# Patient Record
Sex: Male | Born: 1945 | Race: White | Hispanic: No | Marital: Single | Smoking: Former smoker
Health system: Southern US, Community
[De-identification: ages and names within clinical notes are randomized; demographics above are authoritative.]

## PROBLEM LIST (undated history)

## (undated) DIAGNOSIS — J189 Pneumonia, unspecified organism: Secondary | ICD-10-CM

## (undated) DIAGNOSIS — M1990 Unspecified osteoarthritis, unspecified site: Secondary | ICD-10-CM

## (undated) DIAGNOSIS — F329 Major depressive disorder, single episode, unspecified: Secondary | ICD-10-CM

## (undated) DIAGNOSIS — C801 Malignant (primary) neoplasm, unspecified: Secondary | ICD-10-CM

## (undated) DIAGNOSIS — E785 Hyperlipidemia, unspecified: Secondary | ICD-10-CM

## (undated) DIAGNOSIS — F32A Depression, unspecified: Secondary | ICD-10-CM

## (undated) DIAGNOSIS — I10 Essential (primary) hypertension: Secondary | ICD-10-CM

## (undated) HISTORY — PX: COLONOSCOPY: SHX174

---

## 1978-12-09 DIAGNOSIS — J189 Pneumonia, unspecified organism: Secondary | ICD-10-CM

## 1978-12-09 HISTORY — DX: Pneumonia, unspecified organism: J18.9

## 2004-08-15 ENCOUNTER — Ambulatory Visit (HOSPITAL_COMMUNITY): Admission: RE | Admit: 2004-08-15 | Discharge: 2004-08-15 | Payer: Self-pay | Admitting: Orthopedic Surgery

## 2004-12-09 DIAGNOSIS — C801 Malignant (primary) neoplasm, unspecified: Secondary | ICD-10-CM

## 2004-12-09 HISTORY — DX: Malignant (primary) neoplasm, unspecified: C80.1

## 2004-12-09 HISTORY — PX: RADIOACTIVE SEED IMPLANT: SHX5150

## 2005-08-30 ENCOUNTER — Ambulatory Visit: Admission: RE | Admit: 2005-08-30 | Discharge: 2005-09-05 | Payer: Self-pay | Admitting: Radiation Oncology

## 2005-09-18 ENCOUNTER — Encounter: Admission: RE | Admit: 2005-09-18 | Discharge: 2005-09-18 | Payer: Self-pay | Admitting: Urology

## 2005-09-18 ENCOUNTER — Ambulatory Visit: Admission: RE | Admit: 2005-09-18 | Discharge: 2005-12-17 | Payer: Self-pay | Admitting: Radiation Oncology

## 2005-10-17 ENCOUNTER — Ambulatory Visit (HOSPITAL_BASED_OUTPATIENT_CLINIC_OR_DEPARTMENT_OTHER): Admission: RE | Admit: 2005-10-17 | Discharge: 2005-10-17 | Payer: Self-pay | Admitting: Urology

## 2005-10-17 ENCOUNTER — Ambulatory Visit (HOSPITAL_COMMUNITY): Admission: RE | Admit: 2005-10-17 | Discharge: 2005-10-17 | Payer: Self-pay | Admitting: Urology

## 2006-08-25 ENCOUNTER — Ambulatory Visit: Payer: Self-pay | Admitting: Gastroenterology

## 2006-09-17 ENCOUNTER — Ambulatory Visit: Payer: Self-pay | Admitting: Gastroenterology

## 2006-10-01 ENCOUNTER — Ambulatory Visit: Payer: Self-pay | Admitting: Gastroenterology

## 2011-10-17 ENCOUNTER — Encounter: Payer: Self-pay | Admitting: Gastroenterology

## 2012-09-04 ENCOUNTER — Encounter: Payer: Self-pay | Admitting: Gastroenterology

## 2014-08-18 ENCOUNTER — Other Ambulatory Visit: Payer: Self-pay | Admitting: Orthopedic Surgery

## 2014-09-02 ENCOUNTER — Ambulatory Visit (HOSPITAL_COMMUNITY)
Admission: RE | Admit: 2014-09-02 | Discharge: 2014-09-02 | Disposition: A | Discharge status: Home / Self Care | Payer: Medicare Other | Source: Ambulatory Visit | Attending: Orthopedic Surgery | Admitting: Orthopedic Surgery

## 2014-09-02 ENCOUNTER — Encounter (HOSPITAL_COMMUNITY): Payer: Self-pay

## 2014-09-02 ENCOUNTER — Encounter (HOSPITAL_COMMUNITY)
Admission: RE | Admit: 2014-09-02 | Discharge: 2014-09-02 | Disposition: A | Discharge status: Home / Self Care | Payer: Medicare Other | Source: Ambulatory Visit | Attending: Orthopedic Surgery | Admitting: Orthopedic Surgery

## 2014-09-02 DIAGNOSIS — Z96659 Presence of unspecified artificial knee joint: Secondary | ICD-10-CM | POA: Diagnosis not present

## 2014-09-02 HISTORY — DX: Hyperlipidemia, unspecified: E78.5

## 2014-09-02 HISTORY — DX: Unspecified osteoarthritis, unspecified site: M19.90

## 2014-09-02 HISTORY — DX: Pneumonia, unspecified organism: J18.9

## 2014-09-02 HISTORY — DX: Essential (primary) hypertension: I10

## 2014-09-02 HISTORY — DX: Malignant (primary) neoplasm, unspecified: C80.1

## 2014-09-02 LAB — COMPREHENSIVE METABOLIC PANEL
ALT: 17 U/L (ref 0–53)
AST: 20 U/L (ref 0–37)
Albumin: 4.1 g/dL (ref 3.5–5.2)
Alkaline Phosphatase: 41 U/L (ref 39–117)
Anion gap: 13 (ref 5–15)
BILIRUBIN TOTAL: 0.7 mg/dL (ref 0.3–1.2)
BUN: 17 mg/dL (ref 6–23)
CHLORIDE: 97 meq/L (ref 96–112)
CO2: 27 meq/L (ref 19–32)
Calcium: 9.3 mg/dL (ref 8.4–10.5)
Creatinine, Ser: 1.09 mg/dL (ref 0.50–1.35)
GFR calc Af Amer: 79 mL/min — ABNORMAL LOW (ref >90)
GFR, EST NON AFRICAN AMERICAN: 68 mL/min — AB (ref >90)
Glucose, Bld: 93 mg/dL (ref 70–99)
Potassium: 4.2 mEq/L (ref 3.7–5.3)
Sodium: 137 mEq/L (ref 137–147)
Total Protein: 6.9 g/dL (ref 6.0–8.3)

## 2014-09-02 LAB — CBC WITH DIFFERENTIAL/PLATELET
BASOS ABS: 0 10*3/uL (ref 0.0–0.1)
Basophils Relative: 1 % (ref 0–1)
Eosinophils Absolute: 0.3 10*3/uL (ref 0.0–0.7)
Eosinophils Relative: 4 % (ref 0–5)
HEMATOCRIT: 41.3 % (ref 39.0–52.0)
Hemoglobin: 14.5 g/dL (ref 13.0–17.0)
Lymphocytes Relative: 23 % (ref 12–46)
Lymphs Abs: 1.6 10*3/uL (ref 0.7–4.0)
MCH: 28.7 pg (ref 26.0–34.0)
MCHC: 35.1 g/dL (ref 30.0–36.0)
MCV: 81.6 fL (ref 78.0–100.0)
MONOS PCT: 11 % (ref 3–12)
Monocytes Absolute: 0.8 10*3/uL (ref 0.1–1.0)
NEUTROS ABS: 4.3 10*3/uL (ref 1.7–7.7)
Neutrophils Relative %: 61 % (ref 43–77)
Platelets: 193 10*3/uL (ref 150–400)
RBC: 5.06 MIL/uL (ref 4.22–5.81)
RDW: 12.7 % (ref 11.5–15.5)
WBC: 7 10*3/uL (ref 4.0–10.5)

## 2014-09-02 LAB — URINALYSIS, ROUTINE W REFLEX MICROSCOPIC
Bilirubin Urine: NEGATIVE
GLUCOSE, UA: NEGATIVE mg/dL
Hgb urine dipstick: NEGATIVE
KETONES UR: NEGATIVE mg/dL
Leukocytes, UA: NEGATIVE
Nitrite: NEGATIVE
PROTEIN: NEGATIVE mg/dL
Specific Gravity, Urine: 1.011 (ref 1.005–1.030)
Urobilinogen, UA: 1 mg/dL (ref 0.0–1.0)
pH: 6 (ref 5.0–8.0)

## 2014-09-02 LAB — APTT: APTT: 31 s (ref 24–37)

## 2014-09-02 LAB — PROTIME-INR
INR: 0.99 (ref 0.00–1.49)
PROTHROMBIN TIME: 13.1 s (ref 11.6–15.2)

## 2014-09-02 LAB — SURGICAL PCR SCREEN
MRSA, PCR: NEGATIVE
STAPHYLOCOCCUS AUREUS: NEGATIVE

## 2014-09-02 NOTE — Progress Notes (Signed)
Primary - dr. Wenda Overland No cardiologist Stress test, ekg dr. Sabra Heck at cardiology consultant few years ago - will request.

## 2014-09-02 NOTE — Progress Notes (Signed)
09/02/14 1002  OBSTRUCTIVE SLEEP APNEA  Have you ever been diagnosed with sleep apnea through a sleep study? No  Do you snore loudly (loud enough to be heard through closed doors)?  0  Do you often feel tired, fatigued, or sleepy during the daytime? 1  Has anyone observed you stop breathing during your sleep? 0  Do you have, or are you being treated for high blood pressure? 1  BMI more than 35 kg/m2? 0  Age over 69 years old? 1  Neck circumference greater than 40 cm/16 inches? 1  Gender: 1  Obstructive Sleep Apnea Score 5  Score 4 or greater  Results sent to PCP

## 2014-09-02 NOTE — Pre-Procedure Instructions (Signed)
[facility]  09/02/2014   Your procedure is scheduled on:  Monday, October 5th  Report to [facility] at [phone] AM.  Call this number if you have problems the morning of surgery: [phone]   Remember:   Do not eat food or drink liquids after midnight.   Take these medicines the morning of surgery with A SIP OF WATER:    Do not wear jewelry, make-up or nail polish.  Do not wear lotions, powders, or perfumes. You may wear deodorant.  Do not shave 48 hours prior to surgery. Men may shave face and neck.  Do not bring valuables to the hospital.  [facility] is not responsible for any belongings or valuables.               Contacts, dentures or bridgework may not be worn into surgery.  Leave suitcase in the car. After surgery it may be brought to your room.  For patients admitted to the hospital, discharge time is determined by your treatment team.           Please read over the following fact sheets that you were given: Pain Booklet, Coughing and Deep Breathing, Blood Transfusion Information, MRSA Information and Surgical Site Infection Prevention [facility] - Preparing for Surgery  Before surgery, you can play an important role.  Because skin is not sterile, your skin needs to be as free of germs as possible.  You can reduce the number of germs on you skin by washing with CHG (chlorahexidine gluconate) soap before surgery.  CHG is an antiseptic cleaner which kills germs and bonds with the skin to continue killing germs even after washing.  Please DO NOT use if you have an allergy to CHG or antibacterial soaps.  If your skin becomes reddened/irritated stop using the CHG and inform your nurse when you arrive at Short Stay.  Do not shave (including legs and underarms) for at least 48 hours prior to the first CHG shower.  You may shave your face.  Please follow these instructions carefully:   1.  Shower with CHG Soap the night before surgery and the morning of  Surgery.  2.  If you choose to wash your hair, wash your hair first as usual with your normal shampoo.  3.  After you shampoo, rinse your hair and body thoroughly to remove the shampoo.  4.  Use CHG as you would any other liquid soap.  You can apply CHG directly to the skin and wash gently with scrungie or a clean washcloth.  5.  Apply the CHG Soap to your body ONLY FROM THE NECK DOWN.  Do not use on open wounds or open sores.  Avoid contact with your eyes, ears, mouth and genitals (private parts).  Wash genitals (private parts) with your normal soap.  6.  Wash thoroughly, paying special attention to the area where your surgery will be performed.  7.  Thoroughly rinse your body with warm water from the neck down.  8.  DO NOT shower/wash with your normal soap after using and rinsing off the CHG Soap.  9.  Pat yourself dry with a clean towel.            10.  Wear clean pajamas.            11.  Place clean sheets on your bed the night of your first shower and do not sleep with pets.  Day of Surgery  Do not apply any  lotions/deoderants the morning of surgery.  Please wear clean clothes to the hospital/surgery center.

## 2014-09-03 LAB — URINE CULTURE: Colony Count: 2000

## 2014-09-06 ENCOUNTER — Inpatient Hospital Stay (HOSPITAL_COMMUNITY): Admission: RE | Admit: 2014-09-06 | Payer: Self-pay | Source: Ambulatory Visit

## 2014-09-11 MED ORDER — TRANEXAMIC ACID 100 MG/ML IV SOLN
1000.0000 mg | INTRAVENOUS | Status: AC
  Administered 2014-09-12: 1000 mg via INTRAVENOUS
  Filled 2014-09-11: qty 10

## 2014-09-11 MED ORDER — CEFAZOLIN SODIUM-DEXTROSE 2-3 GM-% IV SOLR
2.0000 g | INTRAVENOUS | Status: AC
  Administered 2014-09-12: 2 g via INTRAVENOUS
  Filled 2014-09-11: qty 50

## 2014-09-11 MED ORDER — CHLORHEXIDINE GLUCONATE 4 % EX LIQD
60.0000 mL | Freq: Once | CUTANEOUS | Status: DC
  Filled 2014-09-11: qty 60

## 2014-09-11 MED ORDER — SODIUM CHLORIDE 0.9 % IV SOLN: INTRAVENOUS | Status: DC

## 2014-09-11 MED ORDER — BUPIVACAINE LIPOSOME 1.3 % IJ SUSP
20.0000 mL | Freq: Once | INTRAMUSCULAR | Status: DC
  Filled 2014-09-11: qty 20

## 2014-09-12 ENCOUNTER — Encounter (HOSPITAL_COMMUNITY): Payer: Self-pay | Admitting: Anesthesiology

## 2014-09-12 ENCOUNTER — Inpatient Hospital Stay (HOSPITAL_COMMUNITY): Payer: Medicare Other | Admitting: Certified Registered Nurse Anesthetist

## 2014-09-12 ENCOUNTER — Encounter (HOSPITAL_COMMUNITY)
Admission: RE | Disposition: A | Discharge status: Home Health | Payer: Self-pay | Source: Ambulatory Visit | Attending: Orthopedic Surgery

## 2014-09-12 ENCOUNTER — Encounter (HOSPITAL_COMMUNITY): Payer: Medicare Other | Admitting: Certified Registered Nurse Anesthetist

## 2014-09-12 ENCOUNTER — Inpatient Hospital Stay (HOSPITAL_COMMUNITY)
Admission: RE | Admit: 2014-09-12 | Discharge: 2014-09-14 | DRG: 470 | Disposition: A | Discharge status: Home Health | Payer: Medicare Other | Source: Ambulatory Visit | Attending: Orthopedic Surgery | Admitting: Orthopedic Surgery

## 2014-09-12 DIAGNOSIS — D62 Acute posthemorrhagic anemia: Secondary | ICD-10-CM | POA: Diagnosis not present

## 2014-09-12 DIAGNOSIS — I10 Essential (primary) hypertension: Secondary | ICD-10-CM | POA: Diagnosis present

## 2014-09-12 DIAGNOSIS — M179 Osteoarthritis of knee, unspecified: Secondary | ICD-10-CM | POA: Diagnosis present

## 2014-09-12 DIAGNOSIS — Z96659 Presence of unspecified artificial knee joint: Secondary | ICD-10-CM

## 2014-09-12 DIAGNOSIS — M25561 Pain in right knee: Secondary | ICD-10-CM | POA: Diagnosis present

## 2014-09-12 HISTORY — DX: Major depressive disorder, single episode, unspecified: F32.9

## 2014-09-12 HISTORY — PX: TOTAL KNEE ARTHROPLASTY: SHX125

## 2014-09-12 HISTORY — DX: Depression, unspecified: F32.A

## 2014-09-12 LAB — CBC
HEMATOCRIT: 39.6 % (ref 39.0–52.0)
Hemoglobin: 13.8 g/dL (ref 13.0–17.0)
MCH: 29.1 pg (ref 26.0–34.0)
MCHC: 34.8 g/dL (ref 30.0–36.0)
MCV: 83.5 fL (ref 78.0–100.0)
Platelets: 185 10*3/uL (ref 150–400)
RBC: 4.74 MIL/uL (ref 4.22–5.81)
RDW: 12.5 % (ref 11.5–15.5)
WBC: 5.9 10*3/uL (ref 4.0–10.5)

## 2014-09-12 LAB — CREATININE, SERUM
CREATININE: 1.07 mg/dL (ref 0.50–1.35)
GFR calc Af Amer: 80 mL/min — ABNORMAL LOW (ref >90)
GFR, EST NON AFRICAN AMERICAN: 69 mL/min — AB (ref >90)

## 2014-09-12 SURGERY — ARTHROPLASTY, KNEE, TOTAL
Anesthesia: Regional | Site: Knee | Laterality: Right

## 2014-09-12 MED ORDER — ACETAMINOPHEN 325 MG PO TABS
650.0000 mg | ORAL_TABLET | Freq: Four times a day (QID) | ORAL | Status: DC | PRN
Start: 2014-09-12 — End: 2014-09-14

## 2014-09-12 MED ORDER — MIDAZOLAM HCL 2 MG/2ML IJ SOLN
INTRAMUSCULAR | Status: AC
  Administered 2014-09-12: 2 mg via INTRAVENOUS
  Filled 2014-09-12: qty 2

## 2014-09-12 MED ORDER — OXYCODONE HCL ER 10 MG PO T12A
10.0000 mg | EXTENDED_RELEASE_TABLET | Freq: Two times a day (BID) | ORAL | Status: DC
  Administered 2014-09-12 – 2014-09-14 (×4): 10 mg via ORAL
  Filled 2014-09-12 (×4): qty 1

## 2014-09-12 MED ORDER — LACTATED RINGERS IV SOLN
INTRAVENOUS | Status: DC | PRN
  Administered 2014-09-12: 10:00:00 via INTRAVENOUS

## 2014-09-12 MED ORDER — CEFAZOLIN SODIUM-DEXTROSE 2-3 GM-% IV SOLR
2.0000 g | Freq: Four times a day (QID) | INTRAVENOUS | Status: AC
  Administered 2014-09-12: 2 g via INTRAVENOUS
  Filled 2014-09-12 (×2): qty 50

## 2014-09-12 MED ORDER — MENTHOL 3 MG MT LOZG: 1.0000 | LOZENGE | OROMUCOSAL | Status: DC | PRN

## 2014-09-12 MED ORDER — SODIUM CHLORIDE 0.9 % IV SOLN: INTRAVENOUS | Status: DC

## 2014-09-12 MED ORDER — ENOXAPARIN SODIUM 30 MG/0.3ML ~~LOC~~ SOLN
30.0000 mg | Freq: Two times a day (BID) | SUBCUTANEOUS | Status: DC
  Administered 2014-09-13 – 2014-09-14 (×3): 30 mg via SUBCUTANEOUS
  Filled 2014-09-12 (×6): qty 0.3

## 2014-09-12 MED ORDER — ONDANSETRON HCL 4 MG/2ML IJ SOLN
4.0000 mg | Freq: Four times a day (QID) | INTRAMUSCULAR | Status: DC | PRN
  Administered 2014-09-12 – 2014-09-13 (×2): 4 mg via INTRAVENOUS
  Filled 2014-09-12 (×2): qty 2

## 2014-09-12 MED ORDER — HYDROMORPHONE HCL 1 MG/ML IJ SOLN
INTRAMUSCULAR | Status: AC
  Filled 2014-09-12: qty 1

## 2014-09-12 MED ORDER — MIDAZOLAM HCL 2 MG/2ML IJ SOLN
2.0000 mg | Freq: Once | INTRAMUSCULAR | Status: AC
  Administered 2014-09-12: 2 mg via INTRAVENOUS

## 2014-09-12 MED ORDER — HYDROMORPHONE HCL 1 MG/ML IJ SOLN
0.2500 mg | INTRAMUSCULAR | Status: DC | PRN
  Administered 2014-09-12 (×2): 0.5 mg via INTRAVENOUS

## 2014-09-12 MED ORDER — LIDOCAINE HCL (CARDIAC) 20 MG/ML IV SOLN
INTRAVENOUS | Status: DC | PRN
  Administered 2014-09-12: 50 mg via INTRAVENOUS

## 2014-09-12 MED ORDER — PHENOL 1.4 % MT LIQD: 1.0000 | OROMUCOSAL | Status: DC | PRN

## 2014-09-12 MED ORDER — ALUM & MAG HYDROXIDE-SIMETH 200-200-20 MG/5ML PO SUSP: 30.0000 mL | ORAL | Status: DC | PRN

## 2014-09-12 MED ORDER — SODIUM CHLORIDE 0.9 % IR SOLN
Status: DC | PRN
  Administered 2014-09-12: 1000 mL

## 2014-09-12 MED ORDER — CHLORTHALIDONE 25 MG PO TABS
25.0000 mg | ORAL_TABLET | Freq: Every day | ORAL | Status: DC
  Administered 2014-09-12 – 2014-09-13 (×2): 25 mg via ORAL
  Filled 2014-09-12 (×3): qty 1

## 2014-09-12 MED ORDER — BUPIVACAINE LIPOSOME 1.3 % IJ SUSP
INTRAMUSCULAR | Status: DC | PRN
  Administered 2014-09-12: 20 mL

## 2014-09-12 MED ORDER — DOCUSATE SODIUM 100 MG PO CAPS
100.0000 mg | ORAL_CAPSULE | Freq: Two times a day (BID) | ORAL | Status: DC
  Administered 2014-09-12 – 2014-09-14 (×4): 100 mg via ORAL
  Filled 2014-09-12 (×6): qty 1

## 2014-09-12 MED ORDER — SENNOSIDES-DOCUSATE SODIUM 8.6-50 MG PO TABS: 1.0000 | ORAL_TABLET | Freq: Every evening | ORAL | Status: DC | PRN

## 2014-09-12 MED ORDER — MIDAZOLAM HCL 5 MG/5ML IJ SOLN
INTRAMUSCULAR | Status: DC | PRN
  Administered 2014-09-12: 2 mg via INTRAVENOUS

## 2014-09-12 MED ORDER — OXYCODONE HCL 5 MG PO TABS
ORAL_TABLET | ORAL | Status: AC
  Filled 2014-09-12: qty 1

## 2014-09-12 MED ORDER — FENTANYL CITRATE 0.05 MG/ML IJ SOLN
100.0000 ug | Freq: Once | INTRAMUSCULAR | Status: AC
  Administered 2014-09-12: 100 ug via INTRAVENOUS

## 2014-09-12 MED ORDER — EPHEDRINE SULFATE 50 MG/ML IJ SOLN
INTRAMUSCULAR | Status: DC | PRN
  Administered 2014-09-12: 10 mg via INTRAVENOUS

## 2014-09-12 MED ORDER — HYDROMORPHONE HCL 1 MG/ML IJ SOLN
1.0000 mg | INTRAMUSCULAR | Status: DC | PRN
Start: 2014-09-12 — End: 2014-09-14
  Administered 2014-09-13: 1 mg via INTRAVENOUS
  Filled 2014-09-12 (×3): qty 1

## 2014-09-12 MED ORDER — PROMETHAZINE HCL 25 MG/ML IJ SOLN: 6.2500 mg | INTRAMUSCULAR | Status: DC | PRN

## 2014-09-12 MED ORDER — CELECOXIB 200 MG PO CAPS
200.0000 mg | ORAL_CAPSULE | Freq: Two times a day (BID) | ORAL | Status: DC
  Administered 2014-09-12 – 2014-09-14 (×4): 200 mg via ORAL
  Filled 2014-09-12 (×5): qty 1

## 2014-09-12 MED ORDER — FENTANYL CITRATE 0.05 MG/ML IJ SOLN
INTRAMUSCULAR | Status: AC
  Filled 2014-09-12: qty 5

## 2014-09-12 MED ORDER — METHOCARBAMOL 1000 MG/10ML IJ SOLN
500.0000 mg | Freq: Four times a day (QID) | INTRAVENOUS | Status: DC | PRN
  Filled 2014-09-12: qty 5

## 2014-09-12 MED ORDER — OXYCODONE HCL 5 MG PO TABS
5.0000 mg | ORAL_TABLET | Freq: Once | ORAL | Status: AC | PRN
  Administered 2014-09-12: 5 mg via ORAL

## 2014-09-12 MED ORDER — OXYCODONE HCL 5 MG PO TABS
5.0000 mg | ORAL_TABLET | ORAL | Status: DC | PRN
  Administered 2014-09-12 – 2014-09-14 (×7): 10 mg via ORAL
  Filled 2014-09-12 (×4): qty 2
  Filled 2014-09-12: qty 1
  Filled 2014-09-12 (×2): qty 2
  Filled 2014-09-12: qty 1

## 2014-09-12 MED ORDER — BUPIVACAINE-EPINEPHRINE (PF) 0.5% -1:200000 IJ SOLN
INTRAMUSCULAR | Status: AC
  Filled 2014-09-12: qty 30

## 2014-09-12 MED ORDER — SIMVASTATIN 40 MG PO TABS
40.0000 mg | ORAL_TABLET | Freq: Every day | ORAL | Status: DC
  Administered 2014-09-12 – 2014-09-14 (×3): 40 mg via ORAL
  Filled 2014-09-12 (×3): qty 1

## 2014-09-12 MED ORDER — FENTANYL CITRATE 0.05 MG/ML IJ SOLN
INTRAMUSCULAR | Status: DC | PRN
  Administered 2014-09-12: 50 ug via INTRAVENOUS

## 2014-09-12 MED ORDER — BUPIVACAINE-EPINEPHRINE 0.5% -1:200000 IJ SOLN
INTRAMUSCULAR | Status: DC | PRN
  Administered 2014-09-12: 20 mL

## 2014-09-12 MED ORDER — MIDAZOLAM HCL 2 MG/2ML IJ SOLN
INTRAMUSCULAR | Status: AC
  Filled 2014-09-12: qty 2

## 2014-09-12 MED ORDER — METOCLOPRAMIDE HCL 5 MG PO TABS
5.0000 mg | ORAL_TABLET | Freq: Three times a day (TID) | ORAL | Status: DC | PRN
  Administered 2014-09-13: 10 mg via ORAL
  Filled 2014-09-12 (×2): qty 2

## 2014-09-12 MED ORDER — ATENOLOL 50 MG PO TABS
50.0000 mg | ORAL_TABLET | Freq: Every day | ORAL | Status: DC
  Administered 2014-09-12: 50 mg via ORAL
  Filled 2014-09-12 (×3): qty 1

## 2014-09-12 MED ORDER — OXYCODONE HCL 5 MG/5ML PO SOLN: 5.0000 mg | Freq: Once | ORAL | Status: AC | PRN

## 2014-09-12 MED ORDER — BUPIVACAINE HCL (PF) 0.75 % IJ SOLN
INTRAMUSCULAR | Status: DC | PRN
  Administered 2014-09-12: 12 mg via INTRATHECAL

## 2014-09-12 MED ORDER — ATENOLOL-CHLORTHALIDONE 50-25 MG PO TABS: 1.0000 | ORAL_TABLET | Freq: Every day | ORAL | Status: DC

## 2014-09-12 MED ORDER — PROPOFOL 10 MG/ML IV BOLUS
INTRAVENOUS | Status: AC
  Filled 2014-09-12: qty 20

## 2014-09-12 MED ORDER — BISACODYL 5 MG PO TBEC: 5.0000 mg | DELAYED_RELEASE_TABLET | Freq: Every day | ORAL | Status: DC | PRN

## 2014-09-12 MED ORDER — ZOLPIDEM TARTRATE 5 MG PO TABS
5.0000 mg | ORAL_TABLET | Freq: Every evening | ORAL | Status: DC | PRN
  Administered 2014-09-12 – 2014-09-13 (×2): 5 mg via ORAL
  Filled 2014-09-12 (×2): qty 1

## 2014-09-12 MED ORDER — PROPOFOL INFUSION 10 MG/ML OPTIME
INTRAVENOUS | Status: DC | PRN
  Administered 2014-09-12: 50 ug/kg/min via INTRAVENOUS

## 2014-09-12 MED ORDER — ROPIVACAINE HCL 5 MG/ML IJ SOLN
INTRAMUSCULAR | Status: DC | PRN
  Administered 2014-09-12: 30 mL via PERINEURAL

## 2014-09-12 MED ORDER — ONDANSETRON HCL 4 MG PO TABS
4.0000 mg | ORAL_TABLET | Freq: Four times a day (QID) | ORAL | Status: DC | PRN
  Administered 2014-09-13: 4 mg via ORAL
  Filled 2014-09-12: qty 1

## 2014-09-12 MED ORDER — FENTANYL CITRATE 0.05 MG/ML IJ SOLN
INTRAMUSCULAR | Status: AC
  Administered 2014-09-12: 100 ug via INTRAVENOUS
  Filled 2014-09-12: qty 2

## 2014-09-12 MED ORDER — METOCLOPRAMIDE HCL 5 MG/ML IJ SOLN: 5.0000 mg | Freq: Three times a day (TID) | INTRAMUSCULAR | Status: DC | PRN

## 2014-09-12 MED ORDER — FLEET ENEMA 7-19 GM/118ML RE ENEM: 1.0000 | ENEMA | Freq: Once | RECTAL | Status: AC | PRN

## 2014-09-12 MED ORDER — ACETAMINOPHEN 650 MG RE SUPP: 650.0000 mg | Freq: Four times a day (QID) | RECTAL | Status: DC | PRN

## 2014-09-12 MED ORDER — DIPHENHYDRAMINE HCL 12.5 MG/5ML PO ELIX: 12.5000 mg | ORAL_SOLUTION | ORAL | Status: DC | PRN

## 2014-09-12 MED ORDER — LACTATED RINGERS IV SOLN
INTRAVENOUS | Status: DC
  Administered 2014-09-12: 50 mL/h via INTRAVENOUS

## 2014-09-12 MED ORDER — METHOCARBAMOL 500 MG PO TABS
500.0000 mg | ORAL_TABLET | Freq: Four times a day (QID) | ORAL | Status: DC | PRN
  Administered 2014-09-12 – 2014-09-14 (×4): 500 mg via ORAL
  Filled 2014-09-12 (×4): qty 1

## 2014-09-12 SURGICAL SUPPLY — 60 items
BANDAGE ESMARK 6X9 LF (GAUZE/BANDAGES/DRESSINGS) ×1 IMPLANT
BLADE SAGITTAL 13X1.27X60 (BLADE) ×2 IMPLANT
BLADE SAGITTAL 13X1.27X60MM (BLADE) ×1
BLADE SAW SGTL 83.5X18.5 (BLADE) ×3 IMPLANT
BLADE SURG 10 STRL SS (BLADE) ×4 IMPLANT
BNDG CMPR 9X6 STRL LF SNTH (GAUZE/BANDAGES/DRESSINGS) ×1
BNDG ESMARK 6X9 LF (GAUZE/BANDAGES/DRESSINGS) ×3
BOWL SMART MIX CTS (DISPOSABLE) ×3 IMPLANT
CAP POR TM CP VIT E LN CER HD ×2 IMPLANT
CEMENT BONE SIMPLEX SPEEDSET (Cement) ×6 IMPLANT
COVER SURGICAL LIGHT HANDLE (MISCELLANEOUS) ×3 IMPLANT
CUFF TOURNIQUET SINGLE 34IN LL (TOURNIQUET CUFF) ×3 IMPLANT
DRAPE EXTREMITY T 121X128X90 (DRAPE) ×3 IMPLANT
DRAPE INCISE IOBAN 66X45 STRL (DRAPES) ×6 IMPLANT
DRAPE PROXIMA HALF (DRAPES) ×3 IMPLANT
DRAPE U-SHAPE 47X51 STRL (DRAPES) ×3 IMPLANT
DRSG ADAPTIC 3X8 NADH LF (GAUZE/BANDAGES/DRESSINGS) ×3 IMPLANT
DRSG PAD ABDOMINAL 8X10 ST (GAUZE/BANDAGES/DRESSINGS) ×3 IMPLANT
DURAPREP 26ML APPLICATOR (WOUND CARE) ×6 IMPLANT
ELECT REM PT RETURN 9FT ADLT (ELECTROSURGICAL) ×3
ELECTRODE REM PT RTRN 9FT ADLT (ELECTROSURGICAL) ×1 IMPLANT
EVACUATOR 1/8 PVC DRAIN (DRAIN) ×3 IMPLANT
GAUZE SPONGE 4X4 12PLY STRL (GAUZE/BANDAGES/DRESSINGS) ×3 IMPLANT
GLOVE BIOGEL M 7.0 STRL (GLOVE) IMPLANT
GLOVE BIOGEL PI IND STRL 7.5 (GLOVE) IMPLANT
GLOVE BIOGEL PI IND STRL 8.5 (GLOVE) ×2 IMPLANT
GLOVE BIOGEL PI INDICATOR 7.5 (GLOVE)
GLOVE BIOGEL PI INDICATOR 8.5 (GLOVE) ×4
GLOVE SURG ORTHO 8.0 STRL STRW (GLOVE) ×6 IMPLANT
GOWN STRL REUS W/ TWL LRG LVL3 (GOWN DISPOSABLE) ×2 IMPLANT
GOWN STRL REUS W/ TWL XL LVL3 (GOWN DISPOSABLE) ×2 IMPLANT
GOWN STRL REUS W/TWL LRG LVL3 (GOWN DISPOSABLE) ×6
GOWN STRL REUS W/TWL XL LVL3 (GOWN DISPOSABLE) ×6
HANDPIECE INTERPULSE COAX TIP (DISPOSABLE) ×3
HOOD PEEL AWAY FACE SHEILD DIS (HOOD) ×12 IMPLANT
KIT BASIN OR (CUSTOM PROCEDURE TRAY) ×3 IMPLANT
KIT ROOM TURNOVER OR (KITS) ×3 IMPLANT
MANIFOLD NEPTUNE II (INSTRUMENTS) ×3 IMPLANT
NDL SPNL 18GX3.5 QUINCKE PK (NEEDLE) IMPLANT
NEEDLE 22X1 1/2 (OR ONLY) (NEEDLE) ×6 IMPLANT
NEEDLE SPNL 18GX3.5 QUINCKE PK (NEEDLE) ×3 IMPLANT
NS IRRIG 1000ML POUR BTL (IV SOLUTION) ×3 IMPLANT
PACK TOTAL JOINT (CUSTOM PROCEDURE TRAY) ×3 IMPLANT
PAD ARMBOARD 7.5X6 YLW CONV (MISCELLANEOUS) ×6 IMPLANT
PADDING CAST COTTON 6X4 STRL (CAST SUPPLIES) ×3 IMPLANT
SET HNDPC FAN SPRY TIP SCT (DISPOSABLE) ×1 IMPLANT
SPONGE GAUZE 4X4 12PLY STER LF (GAUZE/BANDAGES/DRESSINGS) ×2 IMPLANT
STAPLER VISISTAT 35W (STAPLE) ×3 IMPLANT
SUCTION FRAZIER TIP 10 FR DISP (SUCTIONS) ×3 IMPLANT
SUT BONE WAX W31G (SUTURE) ×3 IMPLANT
SUT VIC AB 0 CTB1 27 (SUTURE) ×6 IMPLANT
SUT VIC AB 1 CT1 27 (SUTURE) ×6
SUT VIC AB 1 CT1 27XBRD ANBCTR (SUTURE) ×2 IMPLANT
SUT VIC AB 2-0 CT1 27 (SUTURE) ×6
SUT VIC AB 2-0 CT1 TAPERPNT 27 (SUTURE) ×2 IMPLANT
SYR 20CC LL (SYRINGE) ×6 IMPLANT
TOWEL OR 17X24 6PK STRL BLUE (TOWEL DISPOSABLE) ×3 IMPLANT
TOWEL OR 17X26 10 PK STRL BLUE (TOWEL DISPOSABLE) ×3 IMPLANT
TRAY FOLEY CATH 14FR (SET/KITS/TRAYS/PACK) ×3 IMPLANT
WATER STERILE IRR 1000ML POUR (IV SOLUTION) ×6 IMPLANT

## 2014-09-12 NOTE — Progress Notes (Signed)
Orthopedic Tech Progress Note Patient Details:  Timothy Walton 10-07-1946 [id number] CPM applied to RLE with appropriate settings. OHF applied to bed. Footsie roll provided. CPM Right Knee CPM Right Knee: On Right Knee Flexion (Degrees): 90 Right Knee Extension (Degrees): 0   [name] 09/12/2014, 1:53 PM

## 2014-09-12 NOTE — Anesthesia Procedure Notes (Addendum)
Anesthesia Regional Block:  Adductor canal block  Pre-Anesthetic Checklist: ,, timeout performed, Correct Patient, Correct Site, Correct Laterality, Correct Procedure, Correct Position, site marked, Risks and benefits discussed,  Surgical consent,  Pre-op evaluation,  At surgeon's request and post-op pain management  Laterality: Right and Upper  Prep: chloraprep       Needles:   Needle Type: Echogenic Needle     Needle Length: 9cm 9 cm Needle Gauge: 21 and 21 G  Needle insertion depth: 5 cm   Additional Needles:  Procedures: ultrasound guided (picture in chart) Adductor canal block Narrative:  Start time: 09/12/2014 10:30 AM End time: 09/12/2014 10:45 AM Injection made incrementally with aspirations every 5 mL.  Performed by: Personally  Anesthesiologist: T Zubair Lofton  Additional Notes: Tolerated well   Spinal  Patient location during procedure: OR Start time: 09/12/2014 11:20 AM End time: 09/12/2014 11:30 AM Staffing Anesthesiologist: Sokha Craker Performed by: anesthesiologist  Preanesthetic Checklist Completed: patient identified, site marked, surgical consent, pre-op evaluation, timeout performed, IV checked, risks and benefits discussed and monitors and equipment checked Spinal Block Patient position: sitting Prep: Betadine Location: L3-4 Injection technique: single-shot Needle Needle type: Tuohy  Needle gauge: 25 G Needle length: 9 cm Needle insertion depth: 5 cm Assessment Sensory level: T6 Additional Notes Tolerated well

## 2014-09-12 NOTE — Progress Notes (Signed)
Notified OR nurse in room with Dr. Ronnie Derby of patient taking Goody PM last 2 nights, she stated she would notifiy DR.Lucey.

## 2014-09-12 NOTE — H&P (Signed)
  Timothy Walton MRN:  [id number] DOB/SEX:  05-21-1946/male  CHIEF COMPLAINT:  Painful right Knee  HISTORY: Patient is a 68 y.o. male presented with a history of pain in the right knee. Onset of symptoms was gradual starting several years ago with gradually worsening course since that time. Prior procedures on the knee include arthroscopy. Patient has been treated conservatively with over-the-counter NSAIDs and activity modification. Patient currently rates pain in the knee at 10 out of 10 with activity. There is pain at night.  PAST MEDICAL HISTORY: There are no active problems to display for this patient.  Past Medical History  Diagnosis Date  . Cancer 2006    prostate cancer  . Hypertension   . Pneumonia 79    hx of  . Arthritis   . Hyperlipemia    Past Surgical History  Procedure Laterality Date  . Colonoscopy    . Radioactive seed implant  2006    prostate     MEDICATIONS:   No prescriptions prior to admission    ALLERGIES:  No Known Allergies  REVIEW OF SYSTEMS:  A comprehensive review of systems was negative.   FAMILY HISTORY:  No family history on file.  SOCIAL HISTORY:   History  Substance Use Topics  . Smoking status: Former Research scientist (life sciences)  . Smokeless tobacco: Not on file  . Alcohol Use: 15.0 oz/week    25 Cans of beer per week     Comment: 4-6 beer /day     EXAMINATION:  Vital signs in last 24 hours:    General appearance: alert, cooperative and no distress Lungs: clear to auscultation bilaterally Heart: regular rate and rhythm, S1, S2 normal, no murmur, click, rub or gallop Abdomen: soft, non-tender; bowel sounds normal; no masses,  no organomegaly Extremities: extremities normal, atraumatic, no cyanosis or edema and Homans sign is negative, no sign of DVT Pulses: 2+ and symmetric Skin: Skin color, texture, turgor normal. No rashes or lesions Neurologic: Alert and oriented X 3, normal strength and tone. Normal symmetric reflexes. Normal coordination  and gait  Musculoskeletal:  ROM 0-115, Ligaments intact,  Imaging Review Plain radiographs demonstrate severe degenerative joint disease of the right knee. The overall alignment is significant varus. The bone quality appears to be good for age and reported activity level.  Assessment/Plan: End stage arthritis, right knee   The patient history, physical examination and imaging studies are consistent with advanced degenerative joint disease of the right knee. The patient has failed conservative treatment.  The clearance notes were reviewed.  After discussion with the patient it was felt that Total Knee Replacement was indicated. The procedure,  risks, and benefits of total knee arthroplasty were presented and reviewed. The risks including but not limited to aseptic loosening, infection, blood clots, vascular injury, stiffness, patella tracking problems complications among others were discussed. The patient acknowledged the explanation, agreed to proceed with the plan.  Timothy Walton 09/12/2014, 6:46 AM

## 2014-09-12 NOTE — Anesthesia Preprocedure Evaluation (Signed)
Anesthesia Evaluation  Patient identified by MRN, date of birth, ID band Patient awake    Reviewed: Allergy & Precautions, H&P , NPO status , Patient's Chart, lab work & pertinent test results  History of Anesthesia Complications Negative for: history of anesthetic complications  Airway Mallampati: II  Neck ROM: Full    Dental   Pulmonary former smoker,  breath sounds clear to auscultation        Cardiovascular hypertension, Rhythm:Regular Rate:Normal     Neuro/Psych    GI/Hepatic negative GI ROS, Neg liver ROS,   Endo/Other  negative endocrine ROS  Renal/GU negative Renal ROS     Musculoskeletal  (+) Arthritis -,   Abdominal (+) + obese,   Peds  Hematology   Anesthesia Other Findings   Reproductive/Obstetrics                           Anesthesia Physical Anesthesia Plan  ASA: II  Anesthesia Plan:    Post-op Pain Management:    Induction: Intravenous  Airway Management Planned:   Additional Equipment:   Intra-op Plan:   Post-operative Plan:   Informed Consent:   Plan Discussed with:   Anesthesia Plan Comments:         Anesthesia Quick Evaluation

## 2014-09-12 NOTE — Transfer of Care (Signed)
Immediate Anesthesia Transfer of Care Note  Patient: Timothy Walton  Procedure(s) Performed: Procedure(s): RIGHT TOTAL KNEE ARTHROPLASTY (Right)  Patient Location: PACU  Anesthesia Type:MAC  Level of Consciousness: awake, alert  and oriented  Airway & Oxygen Therapy: Patient Spontanous Breathing and Patient connected to nasal cannula oxygen  Post-op Assessment: Report given to PACU RN, Post -op Vital signs reviewed and stable and Patient moving all extremities X 4  Post vital signs: Reviewed and stable  Complications: No apparent anesthesia complications

## 2014-09-13 ENCOUNTER — Encounter (HOSPITAL_COMMUNITY): Payer: Self-pay | Admitting: General Practice

## 2014-09-13 LAB — BASIC METABOLIC PANEL
Anion gap: 14 (ref 5–15)
BUN: 17 mg/dL (ref 6–23)
CO2: 27 mEq/L (ref 19–32)
CREATININE: 1 mg/dL (ref 0.50–1.35)
Calcium: 8.5 mg/dL (ref 8.4–10.5)
Chloride: 94 mEq/L — ABNORMAL LOW (ref 96–112)
GFR calc Af Amer: 87 mL/min — ABNORMAL LOW (ref >90)
GFR, EST NON AFRICAN AMERICAN: 75 mL/min — AB (ref >90)
GLUCOSE: 119 mg/dL — AB (ref 70–99)
Potassium: 4.2 mEq/L (ref 3.7–5.3)
SODIUM: 135 meq/L — AB (ref 137–147)

## 2014-09-13 LAB — CBC
HCT: 35.9 % — ABNORMAL LOW (ref 39.0–52.0)
Hemoglobin: 12.4 g/dL — ABNORMAL LOW (ref 13.0–17.0)
MCH: 28.4 pg (ref 26.0–34.0)
MCHC: 34.5 g/dL (ref 30.0–36.0)
MCV: 82.2 fL (ref 78.0–100.0)
Platelets: 206 10*3/uL (ref 150–400)
RBC: 4.37 MIL/uL (ref 4.22–5.81)
RDW: 12.5 % (ref 11.5–15.5)
WBC: 10.3 10*3/uL (ref 4.0–10.5)

## 2014-09-13 NOTE — Op Note (Signed)
TOTAL KNEE REPLACEMENT OPERATIVE NOTE:  09/12/2014  4:34 PM  PATIENT:  Timothy Walton  68 y.o. male  PRE-OPERATIVE DIAGNOSIS:  osteoarthritis right knee  POST-OPERATIVE DIAGNOSIS:  OA right knee  PROCEDURE:  Procedure(s): RIGHT TOTAL KNEE ARTHROPLASTY  SURGEON:  Surgeon(s): Vickey Huger, MD  PHYSICIAN ASSISTANT: Carlynn Spry, [facility]  ANESTHESIA:   general  DRAINS: Hemovac  SPECIMEN: None  COUNTS:  Correct  TOURNIQUET:   Total Tourniquet Time Documented: Thigh (Right) - 50 minutes Total: Thigh (Right) - 50 minutes   DICTATION:  Indication for procedure:    The patient is a 68 y.o. male who has failed conservative treatment for osteoarthritis right knee.  Informed consent was obtained prior to anesthesia. The risks versus benefits of the operation were explain and in a way the patient can, and did, understand.   On the implant demand matching protocol, this patient scored 8.  Therefore, this patient was not receive a polyethylene insert with vitamin E which is a high demand implant.  Description of procedure:     The patient was taken to the operating room and placed under anesthesia.  The patient was positioned in the usual fashion taking care that all body parts were adequately padded and/or protected.  I foley catheter was not placed.  A tourniquet was applied and the leg prepped and draped in the usual sterile fashion.  The extremity was exsanguinated with the esmarch and tourniquet inflated to 350 mmHg.  Pre-operative range of motion was normal.  The knee was in 10 degree of mild varus.  A midline incision approximately 6-7 inches long was made with a #10 blade.  A new blade was used to make a parapatellar arthrotomy going 2-3 cm into the quadriceps tendon, over the patella, and alongside the medial aspect of the patellar tendon.  A synovectomy was then performed with the #10 blade and forceps. I then elevated the deep MCL off the medial tibial metaphysis subperiosteally  around to the semimembranosus attachment.    I everted the patella and used calipers to measure patellar thickness.  I used the reamer to ream down to appropriate thickness to recreate the native thickness.  I then removed excess bone with the rongeur and sagittal saw.  I used the appropriately sized template and drilled the three lug holes.  I then put the trial in place and measured the thickness with the calipers to ensure recreation of the native thickness.  The trial was then removed and the patella subluxed and the knee brought into flexion.  A homan retractor was place to retract and protect the patella and lateral structures.  A Z-retractor was place medially to protect the medial structures.  The extra-medullary alignment system was used to make cut the tibial articular surface perpendicular to the anamotic axis of the tibia and in 3 degrees of posterior slope.  The cut surface and alignment jig was removed.  I then used the intramedullary alignment guide to make a 3 valgus cut on the distal femur.  I then marked out the epicondylar axis on the distal femur.  The posterior condylar axis measured 3 degrees.  I then used the anterior referencing sizer and measured the femur to be a size 8.  The 4-In-1 cutting block was screwed into place in external rotation matching the posterior condylar angle, making our cuts perpendicular to the epicondylar axis.  Anterior, posterior and chamfer cuts were made with the sagittal saw.  The cutting block and cut pieces were removed.  A lamina spreader was placed in 90 degrees of flexion.  The ACL, PCL, menisci, and posterior condylar osteophytes were removed.  A 11 mm spacer blocked was found to offer good flexion and extension gap balance after minimal in degree releasing.   The scoop retractor was then placed and the femoral finishing block was pinned in place.  The small sagittal saw was used as well as the lug drill to finish the femur.  The block and cut  surfaces were removed and the medullary canal hole filled with autograft bone from the cut pieces.  The tibia was delivered forward in deep flexion and external rotation.  A size F tray was selected and pinned into place centered on the medial 1/3 of the tibial tubercle.  The reamer and keel was used to prepare the tibia through the tray.    I then trialed with the size 8 femur, size F tibia, a 11 mm insert and the 35 patella.  I had excellent flexion/extension gap balance, excellent patella tracking.  Flexion was full and beyond 120 degrees; extension was zero.  These components were chosen and the staff opened them to me on the back table while the knee was lavaged copiously and the cement mixed.  The soft tissue was infiltrated with 60cc of exparel 1.3% through a 21 gauge needle.  I cemented in the components and removed all excess cement.  The polyethylene tibial component was snapped into place and the knee placed in extension while cement was hardening.  The capsule was infilltrated with 30cc of .25% Marcaine with epinephrine.  A hemovac was place in the joint exiting superolaterally.  A pain pump was place superomedially superficial to the arthrotomy.  Once the cement was hard, the tourniquet was let down.  Hemostasis was obtained.  The arthrotomy was closed with figure-8 #1 vicryl sutures.  The deep soft tissues were closed with #0 vicryls and the subcuticular layer closed with a running #2-0 vicryl.  The skin was reapproximated and closed with skin staples.  The wound was dressed with xeroform, 4 x4's, 2 ABD sponges, a single layer of webril and a TED stocking.   The patient was then awakened, extubated, and taken to the recovery room in stable condition.  BLOOD LOSS:  300cc DRAINS: 1 hemovac, 1 pain catheter COMPLICATIONS:  None.  PLAN OF CARE: Admit to inpatient   PATIENT DISPOSITION:  PACU - hemodynamically stable.   Delay start of Pharmacological VTE agent (>24hrs) due to surgical  blood loss or risk of bleeding:  not applicable  Please fax a copy of this op note to my office at [phone] (please only include page 1 and 2 of the Case Information op note)

## 2014-09-13 NOTE — Progress Notes (Signed)
SPORTS MEDICINE AND JOINT REPLACEMENT  Lara Mulch, MD   Carlynn Spry, PA-C [place], [place], [place]  [id number]                             [phone]   PROGRESS NOTE  Subjective:  negative for Chest Pain  negative for Shortness of Breath  negative for Nausea/Vomiting   negative for Calf Pain  negative for Bowel Movement   Tolerating Diet: yes         Patient reports pain as 6 on 0-10 scale.    Objective: Vital signs in last 24 hours:   Patient Vitals for the past 24 hrs:  BP Temp Temp src Pulse Resp SpO2  09/13/14 0602 111/68 mmHg 98.1 F (36.7 C) - 64 18 96 %  09/13/14 0400 - - - - 18 96 %  09/13/14 0045 125/68 mmHg 98.6 F (37 C) - 65 18 96 %  09/12/14 2357 - - - - 18 96 %  09/12/14 2033 116/68 mmHg 98 F (36.7 C) - 60 18 96 %  09/12/14 2000 - - - - 18 96 %  09/12/14 1730 136/81 mmHg 98.2 F (36.8 C) Oral 65 18 100 %  09/12/14 1700 130/73 mmHg 97.9 F (36.6 C) - 63 17 100 %  09/12/14 1645 - - - 58 17 100 %  09/12/14 1630 111/68 mmHg - - 49 15 100 %  09/12/14 1615 - - - 54 15 100 %  09/12/14 1600 - - - 46 15 100 %  09/12/14 1558 116/66 mmHg - - 44 15 100 %  09/12/14 1545 - - - 55 14 100 %  09/12/14 1530 - - - 50 14 100 %  09/12/14 1527 116/76 mmHg - - 53 18 100 %  09/12/14 1515 - - - 54 14 100 %  09/12/14 1500 - 97.6 F (36.4 C) - 58 15 100 %  09/12/14 1457 117/60 mmHg - - 54 18 100 %  09/12/14 1445 - - - 54 15 100 %  09/12/14 1442 114/65 mmHg - - 51 16 100 %  09/12/14 1430 - - - 51 17 100 %  09/12/14 1427 114/65 mmHg - - 53 12 100 %  09/12/14 1415 - - - 58 13 100 %  09/12/14 1412 117/67 mmHg - - 51 16 100 %  09/12/14 1400 - - - 56 13 100 %  09/12/14 1357 107/71 mmHg - - 55 16 100 %  09/12/14 1345 - - - 57 15 99 %  09/12/14 1342 107/69 mmHg - - 54 15 100 %  09/12/14 1330 - - - 60 17 99 %  09/12/14 1327 116/62 mmHg - - 61 18 96 %  09/12/14 1325 - 97.4 F (36.3 C) - - - -  09/12/14 1050 136/74 mmHg - - 61 16 97 %  09/12/14 1045 - - -  62 11 95 %  09/12/14 1043 - - - 66 16 98 %  09/12/14 1042 139/72 mmHg - - 66 13 95 %  09/12/14 1041 - - - 67 12 95 %  09/12/14 1040 - - - 65 14 99 %  09/12/14 1039 - - - 65 18 99 %  09/12/14 1038 - - - 66 16 100 %  09/12/14 1037 150/71 mmHg - - 68 16 99 %  09/12/14 1036 - - - 66 19 99 %  09/12/14 1035 - - - 68 18 100 %  09/12/14 1034 - - - 62 11 100 %  09/12/14 1033 - - - 71 17 100 %  09/12/14 1032 150/86 mmHg - - 65 17 99 %  09/12/14 1031 - - - 64 15 98 %  09/12/14 1024 - - - 65 12 98 %  09/12/14 0924 141/85 mmHg 97.7 F (36.5 C) Oral 69 18 97 %    @flow {1959:LAST@   Intake/Output from previous day:   10/05 0701 - 10/06 0700 In: 1430 [P.O.:240; I.V.:950] Out: 1400 [Urine:950; Drains:350]   Intake/Output this shift:       Intake/Output     10/05 0701 - 10/06 0700 10/06 0701 - 10/07 0700   P.O. 240    I.V. 950    Other 130    IV Piggyback 110    Total Intake 1430     Urine 950    Drains 350    Blood 100    Total Output 1400     Net +30          Urine Occurrence 1 x       LABORATORY DATA:  Recent Labs  09/12/14 1440 09/13/14 0512  WBC 5.9 10.3  HGB 13.8 12.4*  HCT 39.6 35.9*  PLT 185 206    Recent Labs  09/12/14 1440 09/13/14 0512  NA  --  135*  K  --  4.2  CL  --  94*  CO2  --  27  BUN  --  17  CREATININE 1.07 1.00  GLUCOSE  --  119*  CALCIUM  --  8.5   Lab Results  Component Value Date   INR 0.99 09/02/2014    Examination:  General appearance: alert, cooperative and no distress Extremities: Homans sign is negative, no sign of DVT  Wound Exam: clean, dry, intact   Drainage:  Scant/small amount Serosanguinous exudate  Motor Exam: EHL and FHL Intact  Sensory Exam: Deep Peroneal normal   Assessment:    1 Day Post-Op  Procedure(s) (LRB): RIGHT TOTAL KNEE ARTHROPLASTY (Right)  ADDITIONAL DIAGNOSIS:  Active Problems:   S/P total knee arthroplasty  Acute Blood Loss Anemia   Plan: Physical Therapy as ordered Weight Bearing as  Tolerated (WBAT)  DVT Prophylaxis:  Lovenox  DISCHARGE PLAN: Home  DISCHARGE NEEDS: HHPT, CPM, Walker and 3-in-1 comode seat         Timothy Walton 09/13/2014, 7:20 AM

## 2014-09-13 NOTE — Evaluation (Signed)
Physical Therapy Evaluation Patient Details Name: Timothy Walton MRN: [id number] DOB: August 08, 1946 Today's Date: 09/13/2014   History of Present Illness  R TKA  Clinical Impression  *Pt is s/p TKA resulting in the deficits listed below (see PT Problem List). * Pt will benefit from skilled PT to increase their independence and safety with mobility to allow discharge to the venue listed below.   Assisted pt to edge of bed. In sitting he became dizzy, nauseous, and had vomiting. Sitting BP 95/59, SaO2 100% on RA, HR 79. Pt not able to tolerate transfers or gait, will attempt again later today. Good progress expected once nausea resolves.  **    Follow Up Recommendations Home health PT    Equipment Recommendations  None recommended by PT    Recommendations for Other Services OT consult     Precautions / Restrictions Precautions Precautions: Fall Restrictions RLE Weight Bearing: Weight bearing as tolerated      Mobility  Bed Mobility Overal bed mobility: Needs Assistance Bed Mobility: Supine to Sit;Sit to Supine     Supine to sit: Min assist Sit to supine: Min assist   General bed mobility comments: instructed pt to self assist RLE with LLE, min A to raise trunk, cues for technique  Transfers                 General transfer comment: NT-pt had dizziness, nausea and vomiting in sitting, BP sitting 95/59, HR 79, SaO2 100% RA  Ambulation/Gait                Stairs            Wheelchair Mobility    Modified Rankin (Stroke Patients Only)       Balance                                             Pertinent Vitals/Pain Pain Assessment: 0-10 Pain Score: 6  Pain Descriptors / Indicators: Sore Pain Intervention(s): Limited activity within patient's tolerance;Monitored during session;Premedicated before session;Repositioned;Relaxation;Ice applied    Home Living Family/patient expects to be discharged to:: Private residence Living  Arrangements: Children Available Help at Discharge: Available PRN/intermittently Type of Home: House Home Access: Stairs to enter Entrance Stairs-Rails: None Entrance Stairs-Number of Steps: 3 Home Layout: One level Home Equipment: Cane - single point;Walker - 2 wheels;Bedside commode Additional Comments: tub shower, handicapped height commode    Prior Function Level of Independence: Independent with assistive device(s)         Comments: used cane PTA, no falls     Hand Dominance        Extremity/Trunk Assessment   Upper Extremity Assessment: Overall WFL for tasks assessed           Lower Extremity Assessment: RLE deficits/detail RLE Deficits / Details: ankle WNL, quad set intact but weak, knee flexion AAROM 30* limited by pain, SLR -2/5; sensation intact to light touch    Cervical / Trunk Assessment: Normal  Communication   Communication: No difficulties  Cognition Arousal/Alertness: Awake/alert Behavior During Therapy: WFL for tasks assessed/performed Overall Cognitive Status: Within Functional Limits for tasks assessed                      General Comments      Exercises Total Joint Exercises Ankle Circles/Pumps: AROM;Both;10 reps Quad Sets: AROM;Left;5 reps Heel Slides: AAROM;Left;15 reps;Supine  Hip ABduction/ADduction: AAROM;Left;10 reps;Supine Goniometric ROM: L knee flexion AAROM 30* limited by pain      Assessment/Plan    PT Assessment Patient needs continued PT services  PT Diagnosis Difficulty walking;Acute pain   PT Problem List Decreased strength;Decreased range of motion;Decreased activity tolerance;Pain;Decreased knowledge of use of DME;Decreased mobility  PT Treatment Interventions DME instruction;Gait training;Stair training;Functional mobility training;Therapeutic activities;Patient/family education;Therapeutic exercise   PT Goals (Current goals can be found in the Care Plan section) Acute Rehab PT Goals Patient Stated Goal: to  be able to do yardwork PT Goal Formulation: With patient Time For Goal Achievement: 09/27/14 Potential to Achieve Goals: Good    Frequency 7X/week   Barriers to discharge        Co-evaluation               End of Session   Activity Tolerance: Treatment limited secondary to medical complications (Comment) (nausea, vomiting, dizziness) Patient left: in bed;with call bell/phone within reach;with nursing/sitter in room;with family/visitor present Nurse Communication: Mobility status         Time: 0912-1004 PT Time Calculation (min): 52 min   Charges:   PT Evaluation $Initial PT Evaluation Tier I: 1 Procedure PT Treatments $Therapeutic Exercise: 8-22 mins $Therapeutic Activity: 8-22 mins   PT G Codes:          Timothy Walton 09/13/2014, 10:14 AM  [phone]

## 2014-09-13 NOTE — Progress Notes (Signed)
Occupational Therapy Evaluation Patient Details Name: Timothy Walton MRN: [id number] DOB: 08/08/1946 Today's Date: 09/13/2014    History of Present Illness R TKA   Clinical Impression   PTA, pt lived alone and was independent with mod I with mobility @ cane level. Eval limited this due to becoming nauseated with movement. Educated pt/daughter on tub transfer techniques using 3 in 1. Will plan to see in am to complete education. Pt will not need Ot follow up and will have adequate supervision for D/C home.     Follow Up Recommendations  No OT follow up;Supervision - Intermittent    Equipment Recommendations  None recommended by OT    Recommendations for Other Services       Precautions / Restrictions Precautions Precautions: Fall Precaution Comments: monitor BP, orthostatic during PT eval Restrictions RLE Weight Bearing: Weight bearing as tolerated      Mobility Bed Mobility Overal bed mobility: Needs Assistance Bed Mobility: Supine to Sit;Sit to Supine     Supine to sit: Min assist Sit to supine: Mod assist   General bed mobility comments: attempted bed mobility but pt became nauseated  Transfers Overall transfer level: Needs assistance Equipment used: Rolling walker (2 wheeled) Transfers: Sit to/from Stand Sit to Stand: Mod assist         General transfer comment: unable to assess    Balance                                            ADL Overall ADL's : Needs assistance/impaired             Lower Body Bathing: Moderate assistance       Lower Body Dressing: Moderate assistance               Functional mobility during ADLs:  (unable to assess due to nausea) General ADL Comments: educated pt's daughter on tub transfers using 3 in 1 by backing up to tub and leaving operated let outside tub propped on stool if needed. Daughter verbalized understanding.     Vision                     Perception     Praxis       Pertinent Vitals/Pain Pain Assessment: 0-10 Pain Score: 3  Pain Location: rknee Pain Descriptors / Indicators: Aching Pain Intervention(s): Limited activity within patient's tolerance     Hand Dominance     Extremity/Trunk Assessment Upper Extremity Assessment Upper Extremity Assessment: Overall WFL for tasks assessed   Lower Extremity Assessment Lower Extremity Assessment: Defer to PT evaluation   Cervical / Trunk Assessment Cervical / Trunk Assessment: Normal   Communication Communication Communication: No difficulties   Cognition Arousal/Alertness: Awake/alert Behavior During Therapy: WFL for tasks assessed/performed Overall Cognitive Status: Within Functional Limits for tasks assessed                     General Comments       Exercises       Shoulder Instructions      Home Living Family/patient expects to be discharged to:: Private residence Living Arrangements: Children Available Help at Discharge: Available 24 hours/day Type of Home: House Home Access: Stairs to enter [employer] of Steps: 3 Entrance Stairs-Rails: None Home Layout: One level     Bathroom Shower/Tub: Tub/shower unit Shower/tub characteristics: Curtain  Bathroom Accessibility: Yes How Accessible: Accessible via walker Home Equipment: [place] - single point;Walker - 2 wheels;Bedside commode          Prior Functioning/Environment Level of Independence: Independent        Comments: used cane PTA, no falls (LB ADL were difficulty due to knee pain)    OT Diagnosis: Generalized weakness;Acute pain   OT Problem List: Decreased strength;Decreased range of motion;Decreased safety awareness;Decreased knowledge of use of DME or AE;Decreased knowledge of precautions;Pain   OT Treatment/Interventions: Self-care/ADL training;DME and/or AE instruction;Therapeutic activities;Patient/family education    OT Goals(Current goals can be found in the care plan section) Acute  Rehab OT Goals Patient Stated Goal: to be able to do yardwork OT Goal Formulation: With patient Time For Goal Achievement: 09/20/14 Potential to Achieve Goals: Good  OT Frequency: Min 2X/week   Barriers to D/C:            Co-evaluation              End of Session CPM Right Knee CPM Right Knee: Off Right Knee Flexion (Degrees): 60 Right Knee Extension (Degrees): 0 Nurse Communication: Mobility status  Activity Tolerance: Other (comment) (limited by c/o nausea) Patient left: in bed;with call bell/phone within reach;with family/visitor present   Time: 1650-1705 OT Time Calculation (min): 15 min Charges:  OT General Charges $OT Visit: 1 Procedure OT Evaluation $Initial OT Evaluation Tier I: 1 Procedure OT Treatments $Self Care/Home Management : 8-22 mins G-Codes:    Maranda Marte,HILLARY 09/21/2014, 5:23 PM   [facility], OTR/L  [phone] 09/21/14

## 2014-09-13 NOTE — Progress Notes (Signed)
Physical Therapy Treatment Patient Details Name: Timothy Walton MRN: [id number] DOB: [birth year] Today's Date: 09/13/2014    History of Present Illness R TKA    PT Comments    *Pt ambulated 12' with RW and min assist. Distance limited by dizziness, diaphoresis,  and orthostatic hypotension. BP sitting (immediately after walking) 67/52, HR 76, SaO2 95% on RA. Good progress expected once BP stabilizes.  **  Follow Up Recommendations  Home health PT     Equipment Recommendations  None recommended by PT    Recommendations for Other Services OT consult     Precautions / Restrictions Precautions Precautions: Fall Precaution Comments: monitor BP, orthostatic during PT eval Restrictions RLE Weight Bearing: Weight bearing as tolerated    Mobility  Bed Mobility Overal bed mobility: Needs Assistance Bed Mobility: Supine to Sit;Sit to Supine     Supine to sit: Min assist Sit to supine: Mod assist   General bed mobility comments: instructed pt to self assist RLE with LLE, min A to raise trunk, cues for technique  Transfers Overall transfer level: Needs assistance Equipment used: Rolling walker (2 wheeled) Transfers: Sit to/from Stand Sit to Stand: Mod assist         General transfer comment: assist to rise, cues for hand placement  Ambulation/Gait Ambulation/Gait assistance: Min guard Ambulation Distance (Feet): 12 Feet Assistive device: Rolling walker (2 wheeled) Gait Pattern/deviations: Step-to pattern;Antalgic     General Gait Details: frequent cues for sequencing, distance limited by dizziness, BP 67/52 in sitting immediately after walking   Stairs            Wheelchair Mobility    Modified Rankin (Stroke Patients Only)       Balance                                    Cognition Arousal/Alertness: Awake/alert Behavior During Therapy: WFL for tasks assessed/performed Overall Cognitive Status: Within Functional Limits for tasks  assessed                      Exercises      General Comments        Pertinent Vitals/Pain Pain Score: 4  Pain Location: sore Pain Intervention(s): Limited activity within patient's tolerance;Monitored during session;Premedicated before session;Ice applied    Home Living                      Prior Function            PT Goals (current goals can now be found in the care plan section) Acute Rehab PT Goals Patient Stated Goal: to be able to do yardwork PT Goal Formulation: With patient Time For Goal Achievement: 09/27/14 Potential to Achieve Goals: Good Progress towards PT goals: Progressing toward goals    Frequency  7X/week    PT Plan Current plan remains appropriate    Co-evaluation             End of Session Equipment Utilized During Treatment: Gait belt Activity Tolerance: Treatment limited secondary to medical complications (Comment) (orthostatic hypotension) Patient left: in bed;with family/visitor present;with call bell/phone within reach     Time: 4098-1191 PT Time Calculation (min): 49 min  Charges:  $Gait Training: 8-22 mins $Therapeutic Activity: 23-37 mins                    G Codes:  Timothy Walton 09/13/2014, 2:33 PM [phone]

## 2014-09-13 NOTE — Progress Notes (Signed)
Notified Carlynn Spry, PA regarding patient's hypotensive/dizzy episodes that occurred with PT today. Also let him know that patient threw up after he sat up the first time. Linton Rump said he will come see him this afternoon. No new orders at this time.

## 2014-09-14 LAB — CBC
HCT: 33.5 % — ABNORMAL LOW (ref 39.0–52.0)
Hemoglobin: 11.8 g/dL — ABNORMAL LOW (ref 13.0–17.0)
MCH: 28.6 pg (ref 26.0–34.0)
MCHC: 35.2 g/dL (ref 30.0–36.0)
MCV: 81.3 fL (ref 78.0–100.0)
Platelets: 192 10*3/uL (ref 150–400)
RBC: 4.12 MIL/uL — ABNORMAL LOW (ref 4.22–5.81)
RDW: 12.4 % (ref 11.5–15.5)
WBC: 11.1 10*3/uL — ABNORMAL HIGH (ref 4.0–10.5)

## 2014-09-14 MED ORDER — IBUPROFEN 800 MG PO TABS: 800.0000 mg | ORAL_TABLET | Freq: Three times a day (TID) | ORAL | Status: AC | PRN

## 2014-09-14 MED ORDER — OXYCODONE HCL 5 MG PO TABS: 5.0000 mg | ORAL_TABLET | ORAL | Status: AC | PRN

## 2014-09-14 MED ORDER — METHOCARBAMOL 500 MG PO TABS: 500.0000 mg | ORAL_TABLET | Freq: Four times a day (QID) | ORAL | Status: AC | PRN

## 2014-09-14 MED ORDER — ASPIRIN EC 325 MG PO TBEC: 325.0000 mg | DELAYED_RELEASE_TABLET | Freq: Every day | ORAL | Status: AC

## 2014-09-14 NOTE — Progress Notes (Signed)
Occupational Therapy Treatment Patient Details Name: Timothy Walton MRN: [id number] DOB: July 21, 1946 Today's Date: 09/14/2014    History of present illness Timothy Walton is a 68 y.o. Male s/p R TKA.    OT comments  Pt seen today to address tub transfer and improved safety with ADLs. Pt just finished with PT with BP 112/57 and c/o pain and light headedness. Pt continues to fatigue quickly and education on energy conservation and fall prevention with son present. Pt will continue to benefit from acute OT to address LB ADLs and functional mobility.    Follow Up Recommendations  No OT follow up;Supervision - Intermittent    Equipment Recommendations  None recommended by OT    Recommendations for Other Services      Precautions / Restrictions Precautions Precautions: Fall Restrictions Weight Bearing Restrictions: Yes RLE Weight Bearing: Weight bearing as tolerated       Mobility Bed Mobility Overal bed mobility: Needs Assistance       Supine to sit: Supervision     General bed mobility comments: Pt up in recliner in gym when OT arrived, following PT session.   Transfers Overall transfer level: Needs assistance Equipment used: Rolling walker (2 wheeled) Transfers: Sit to/from Stand Sit to Stand: Min guard         General transfer comment: Cues for safe hand placement and safe technique        ADL Overall ADL's : Needs assistance/impaired                         Toilet Transfer: Min guard;Ambulation;RW           Functional mobility during ADLs: Min guard;Rolling walker General ADL Comments: Educated pt and his son on 3N1 transfer into tub with demo. Pt declined opportunity to practice due to pain and light headedness following PT session. Educated pt/son on safe footwear, fall prevention, and energy conservation techniques. Pt will plan to sponge bathe until improved mobility allows him to safely transfer to tub with supervision.                  Cognition  Arousal/Alertness: Awake/Alert Behavior During Therapy: WFL for tasks assessed/performed Overall Cognitive Status: Within Functional Limits for tasks assessed                                    Pertinent Vitals/ Pain       Pain Assessment: 0-10 Pain Score: 8  Pain Location: R knee Pain Descriptors / Indicators: Aching;Sore Pain Intervention(s): Premedicated before session;Repositioned;Monitored during session;Limited activity within patient's tolerance         Frequency Min 2X/week     Progress Toward Goals  OT Goals(current goals can now be found in the care plan section)  Progress towards OT goals: Progressing toward goals  Acute Rehab OT Goals Patient Stated Goal: home today OT Goal Formulation: With patient Time For Goal Achievement: 09/20/14 Potential to Achieve Goals: Good ADL Goals Pt Will Perform Lower Body Bathing: with caregiver independent in assisting;with min assist;with adaptive equipment;sit to/from stand Pt Will Perform Lower Body Dressing: with min assist;with caregiver independent in assisting;with adaptive equipment;sit to/from stand Pt Will Transfer to Toilet: with min guard assist;ambulating;bedside commode Pt Will Perform Tub/Shower Transfer: with min assist;3 in 1;Tub transfer;with caregiver independent in assisting  Plan Discharge plan remains appropriate       End of Session  Equipment Utilized During Treatment: Rolling walker;Gait belt   Activity Tolerance Patient limited by pain;Other (comment) (limited by light headedness)   Patient Left in chair;with call bell/phone within reach;with family/visitor present   Nurse Communication          Time: 4970-2637 OT Time Calculation (min): 12 min  Charges: OT General Charges $OT Visit: 1 Procedure OT Treatments $Self Care/Home Management : 8-22 mins  Timothy Walton 09/14/2014, 10:50 AM  Timothy Walton, OTR/L Occupational Therapist [phone]  (pager)

## 2014-09-14 NOTE — Anesthesia Postprocedure Evaluation (Signed)
  Anesthesia Post-op Note  Patient: Timothy Walton  Procedure(s) Performed: Procedure(s): RIGHT TOTAL KNEE ARTHROPLASTY (Right)  Patient Location: PACU  Anesthesia Type:Spinal  Level of Consciousness: awake and alert   Airway and Oxygen Therapy: Patient Spontanous Breathing  Post-op Pain: none  Post-op Assessment: Post-op Vital signs reviewed  Post-op Vital Signs: stable  Last Vitals:  Filed Vitals:   09/14/14 0336  BP:   Pulse:   Temp:   Resp: 18    Complications: No apparent anesthesia complications

## 2014-09-14 NOTE — Progress Notes (Signed)
Physical Therapy Treatment Patient Details Name: Timothy Walton MRN: [id number] DOB: [birth year] Today's Date: 09/14/2014    History of Present Illness Timothy Walton is a 68 y.o. Male s/p R TKA.     PT Comments    Patient ambulating better this session but states he still feels weak. BP taken during ambulation and was WNL. Able to complete HEP this session and patient and son stated that they did not feel need to practice steps again this session. Patient safe to D/C from a mobility standpoint based on progression towards goals set on PT eval, unsure if medically stable but will wait on word from PA this afternoon.     Follow Up Recommendations  Home health PT     Equipment Recommendations  None recommended by PT    Recommendations for Other Services       Precautions / Restrictions Precautions Precautions: Fall Restrictions Weight Bearing Restrictions: Yes RLE Weight Bearing: Weight bearing as tolerated    Mobility  Bed Mobility Overal bed mobility: Modified Independent       Supine to sit: Supervision     General bed mobility comments: increased time  Transfers Overall transfer level: Needs assistance Equipment used: Rolling walker (2 wheeled) Transfers: Sit to/from Stand Sit to Stand: Min guard         General transfer comment: Patient with safe technique this session  Ambulation/Gait Ambulation/Gait assistance: Min guard Ambulation Distance (Feet): 150 Feet Assistive device: Rolling walker (2 wheeled) Gait Pattern/deviations: Step-to pattern;Antalgic   Gait velocity interpretation: Below normal speed for age/gender General Gait Details: Cues for upright posture and to relax shoulders. Adjusted RW to assist with posture. Better endurance with ambulation this session   Stairs Stairs: Yes Stairs assistance: Min assist Stair Management: Backwards;With walker;No rails Number of Stairs: 2 General stair comments: Min A for use of RW and to ensure  balance. Cues for sequency and technique. Son present throughout   Wheelchair Mobility    Modified Rankin (Stroke Patients Only)       Balance                                    Cognition Arousal/Alertness: Awake/alert Behavior During Therapy: WFL for tasks assessed/performed Overall Cognitive Status: Within Functional Limits for tasks assessed                      Exercises Total Joint Exercises Quad Sets: AROM;Left;10 reps Heel Slides: AAROM;Left;10 reps Hip ABduction/ADduction: AAROM;Left;10 reps Straight Leg Raises: AAROM;Left;10 reps    General Comments        Pertinent Vitals/Pain Pain Assessment: 0-10 Pain Score: 4  Pain Location: R knee Pain Descriptors / Indicators: Aching;Sore Pain Intervention(s): Monitored during session    Home Living                      Prior Function            PT Goals (current goals can now be found in the care plan section) Acute Rehab PT Goals Patient Stated Goal: home today Progress towards PT goals: Progressing toward goals    Frequency  7X/week    PT Plan Current plan remains appropriate    Co-evaluation             End of Session Equipment Utilized During Treatment: Gait belt Activity Tolerance: Patient tolerated treatment well Patient left: in chair;with  family/visitor present     Time: 8527-7824 PT Time Calculation (min): 25 min  Charges:  $Gait Training: 8-22 mins $Therapeutic Exercise: 8-22 mins $Therapeutic Activity: 8-22 mins                    G Codes:      Timothy Walton 09/14/2014, 11:40 AM  09/14/2014 Timothy Walton PTA [phone] pager [phone] office

## 2014-09-14 NOTE — Care Management Note (Signed)
CARE MANAGEMENT NOTE 09/14/2014  Patient:  Timothy Walton, Timothy Walton   Account Number:  [id number]  Date Initiated:  09/13/2014  Documentation initiated by:  Ricki Miller  Subjective/Objective Assessment:   68 yr old male admitted with arthritis of right knee. Patient underwent right total knee arthroplasty.     Action/Plan:   Patient was preoperatively setup with [facility], no changes. Patient has family support at discharge. Orders and pertinent information faxed to [facility] @ [phone]. CM notified her of patient's discharge.   Anticipated DC Date:  09/14/2014   Anticipated DC Plan:  [place]  CM consult      [facility] Choice  HOME HEALTH  DURABLE MEDICAL EQUIPMENT   Choice offered to / List presented to:  C-1 Patient   DME arranged  3-N-1  [place]  CPM      DME agency  [employer]     [place] arranged  HH-2 PT      [place] agency  [place]   Status of service:  Completed, signed off Medicare Important Message given?  NA - LOS <3 / Initial given by admissions (If response is "NO", the following Medicare IM given date fields will be blank) Date Medicare IM given:   Medicare IM given by:   Date Additional Medicare IM given:   Additional Medicare IM given by:    Discharge Disposition:  [place]  Per UR Regulation:  Reviewed for med. necessity/level of care/duration of stay

## 2014-09-14 NOTE — Progress Notes (Signed)
Physical Therapy Treatment Patient Details Name: TIRRELL BUCHBERGER MRN: [id number] DOB: 09-03-1946 Today's Date: 09/14/2014    History of Present Illness R TKA    PT Comments    Patient feeling better this session. Able to complete hall ambulation and stair training this AM. Patient still slightly fatigues but overall much better than yesterday. BP at end of session 112/57  Follow Up Recommendations  Home health PT     Equipment Recommendations  None recommended by PT    Recommendations for Other Services       Precautions / Restrictions Precautions Precautions: Fall Restrictions RLE Weight Bearing: Weight bearing as tolerated    Mobility  Bed Mobility Overal bed mobility: Needs Assistance       Supine to sit: Supervision     General bed mobility comments: A for R LE postioning  Transfers Overall transfer level: Needs assistance Equipment used: Rolling walker (2 wheeled) Transfers: Sit to/from Stand Sit to Stand: Min guard         General transfer comment: Cues for safe hand placement and safe technique  Ambulation/Gait Ambulation/Gait assistance: Min guard Ambulation Distance (Feet): 90 Feet Assistive device: Rolling walker (2 wheeled) Gait Pattern/deviations: Step-to pattern;Antalgic   Gait velocity interpretation: Below normal speed for age/gender General Gait Details: Cues for upright posture and to relax shoulders. Cues for step length   Stairs Stairs: Yes Stairs assistance: Min assist Stair Management: Backwards;With walker;No rails Number of Stairs: 2 General stair comments: Min A for use of RW and to ensure balance. Cues for sequency and technique. Son present throughout   Wheelchair Mobility    Modified Rankin (Stroke Patients Only)       Balance                                    Cognition Arousal/Alertness: Awake/alert Behavior During Therapy: WFL for tasks assessed/performed Overall Cognitive Status: Within  Functional Limits for tasks assessed                      Exercises      General Comments        Pertinent Vitals/Pain Pain Score: 8  Pain Location: R knee Pain Descriptors / Indicators: Aching;Sore Pain Intervention(s): RN gave pain meds during session;Monitored during session;Limited activity within patient's tolerance    Home Living                      Prior Function            PT Goals (current goals can now be found in the care plan section) Progress towards PT goals: Progressing toward goals    Frequency  7X/week    PT Plan Current plan remains appropriate    Co-evaluation             End of Session Equipment Utilized During Treatment: Gait belt Activity Tolerance: Patient tolerated treatment well Patient left: in chair;with family/visitor present (with OT in ortho gym)     Time: 3016-0109 PT Time Calculation (min): 31 min  Charges:  $Gait Training: 8-22 mins $Therapeutic Activity: 8-22 mins                    G Codes:      Jacqualyn Posey 09/14/2014, 9:13 AM 09/14/2014 Jacqualyn Posey PTA [phone] pager [phone] office

## 2014-09-14 NOTE — Discharge Instructions (Signed)
Diet: As you were doing prior to hospitalization   Activity:  Increase activity slowly as tolerated                  No lifting or driving for 6 weeks  Shower:  May shower without a dressing once there is no drainage from your wound.                 Do NOT wash over the wound.                 Dressing:  You may change your dressing on Thursday                    Then change the dressing daily with sterile 4"x4"s gauze dressing                     And TED hose for knees.  Weight Bearing:  Weight bearing as tolerated as taught in physical therapy.  Use a                                walker or Crutches as instructed.  To prevent constipation: you may use a stool softener such as -               Colace ( over the counter) 100 mg by mouth twice a day                Drink plenty of fluids ( prune juice may be helpful) and high fiber foods                Miralax ( over the counter) for constipation as needed.    Precautions:  If you experience chest pain or shortness of breath - call 911 immediately               For transfer to the hospital emergency department!!               If you develop a fever greater that 101 F, purulent drainage from wound,                             increased redness or drainage from wound, or calf pain -- Call the office.  Follow- Up Appointment:  Please call for an appointment to be seen on 09/27/14                                              [facility] office:  [phone]            [address] [place],  [address]

## 2014-10-04 NOTE — Discharge Summary (Signed)
[facility]   Timothy Mulch, MD   Timothy Spry, PA-C [place], [place], [place]  [address]                             [phone]  PATIENT ID: Timothy Walton        MRN:  [id number]          DOB/AGE: January 14, 1946 / 68 y.o.    DISCHARGE SUMMARY  ADMISSION DATE:    09/12/2014 DISCHARGE DATE:   09/14/2014  ADMISSION DIAGNOSIS: osteoarthritis right knee    DISCHARGE DIAGNOSIS:  osteoarthritis right knee    ADDITIONAL DIAGNOSIS: Active Problems:   S/P total knee arthroplasty  Past Medical History  Diagnosis Date  . Cancer 2006    prostate cancer  . Hypertension   . Pneumonia 80    hx of  . Arthritis   . Hyperlipemia   . Depression     PROCEDURE: Procedure(s): RIGHT TOTAL KNEE ARTHROPLASTY on 09/12/2014  CONSULTS:     HISTORY:  See H&P in chart  HOSPITAL COURSE:  Timothy Walton is a 68 y.o. admitted on 09/12/2014 and found to have a diagnosis of osteoarthritis right knee.  After appropriate laboratory studies were obtained  they were taken to the operating room on 09/12/2014 and underwent Procedure(s): RIGHT TOTAL KNEE ARTHROPLASTY.   They were given perioperative antibiotics:  Anti-infectives   Start     Dose/Rate Route Frequency Ordered Stop   09/12/14 1730  ceFAZolin (ANCEF) IVPB 2 g/50 mL premix     2 g 100 mL/hr over 30 Minutes Intravenous Every 6 hours 09/12/14 1728 09/13/14 0529   09/12/14 0600  ceFAZolin (ANCEF) IVPB 2 g/50 mL premix     2 g 100 mL/hr over 30 Minutes Intravenous On call to O.R. 09/11/14 1237 09/12/14 1130    .  Tolerated the procedure well.  Placed with a foley intraoperatively.  Given Ofirmev at induction and for 48 hours.    POD# 1: Vital signs were stable.  Patient denied Chest pain, shortness of breath, or calf pain.  Patient was started on Lovenox 30 mg subcutaneously twice daily at 8am.  Consults to PT, OT, and care management were made.  The patient was weight bearing as tolerated.  CPM was placed on  the operative leg 0-90 degrees for 6-8 hours a day.  Incentive spirometry was taught.  Dressing was changed.  Hemovac was discontinued.      POD #2, Continued  PT for ambulation and exercise program.  IV saline locked.  O2 discontinued.    The remainder of the hospital course was dedicated to ambulation and strengthening.   The patient was discharged on post op day 2 in  Good condition.  Blood products given:none  DIAGNOSTIC STUDIES: Recent vital signs: No data found.      Recent laboratory studies: No results found for this basename: WBC, HGB, HCT, PLT,  in the last 168 hours No results found for this basename: NA, K, CL, CO2, BUN, CREATININE, GLUCOSE, CALCIUM,  in the last 168 hours Lab Results  Component Value Date   INR 0.99 09/02/2014     Recent Radiographic Studies :  No results found.  DISCHARGE INSTRUCTIONS:   DISCHARGE MEDICATIONS:     Medication List         aspirin EC 325 MG tablet  Take 1 tablet (325 mg total) by mouth daily.  atenolol-chlorthalidone 50-25 MG per tablet  Commonly known as:  TENORETIC  Take 1 tablet by mouth daily.     ibuprofen 800 MG tablet  Commonly known as:  ADVIL,MOTRIN  Take 1 tablet (800 mg total) by mouth every 8 (eight) hours as needed.     methocarbamol 500 MG tablet  Commonly known as:  ROBAXIN  Take 1-2 tablets (500-1,000 mg total) by mouth every 6 (six) hours as needed for muscle spasms.     OSTEO BI-FLEX REGULAR STRENGTH PO  Take 1 tablet by mouth 2 (two) times daily.     OVER THE COUNTER MEDICATION  Take 1 capsule by mouth at bedtime. "Sleep Aid OTC [employer] brand"     oxyCODONE 5 MG immediate release tablet  Commonly known as:  Oxy IR/ROXICODONE  Take 1-2 tablets (5-10 mg total) by mouth every 3 (three) hours as needed for breakthrough pain.     simvastatin 40 MG tablet  Commonly known as:  ZOCOR  Take 40 mg by mouth daily.        FOLLOW UP VISIT:       Follow-up Information   Follow up with  Rudean Haskell, MD. Call on 09/27/2014.   Specialty:  Orthopedic Surgery   Contact information:   [address] [place] [address] [phone]       DISPOSITION: HOME   CONDITION:  Good   Timothy Walton 10/04/2014, 1:09 PM

## 2015-09-04 IMAGING — CR DG CHEST 2V
2 series · 2 of 2 positions shown · non-contrast
Comparison: 09/18/2005.

CLINICAL DATA: Knee replacement.

EXAM:
CHEST  2 VIEW

[w chest pa]
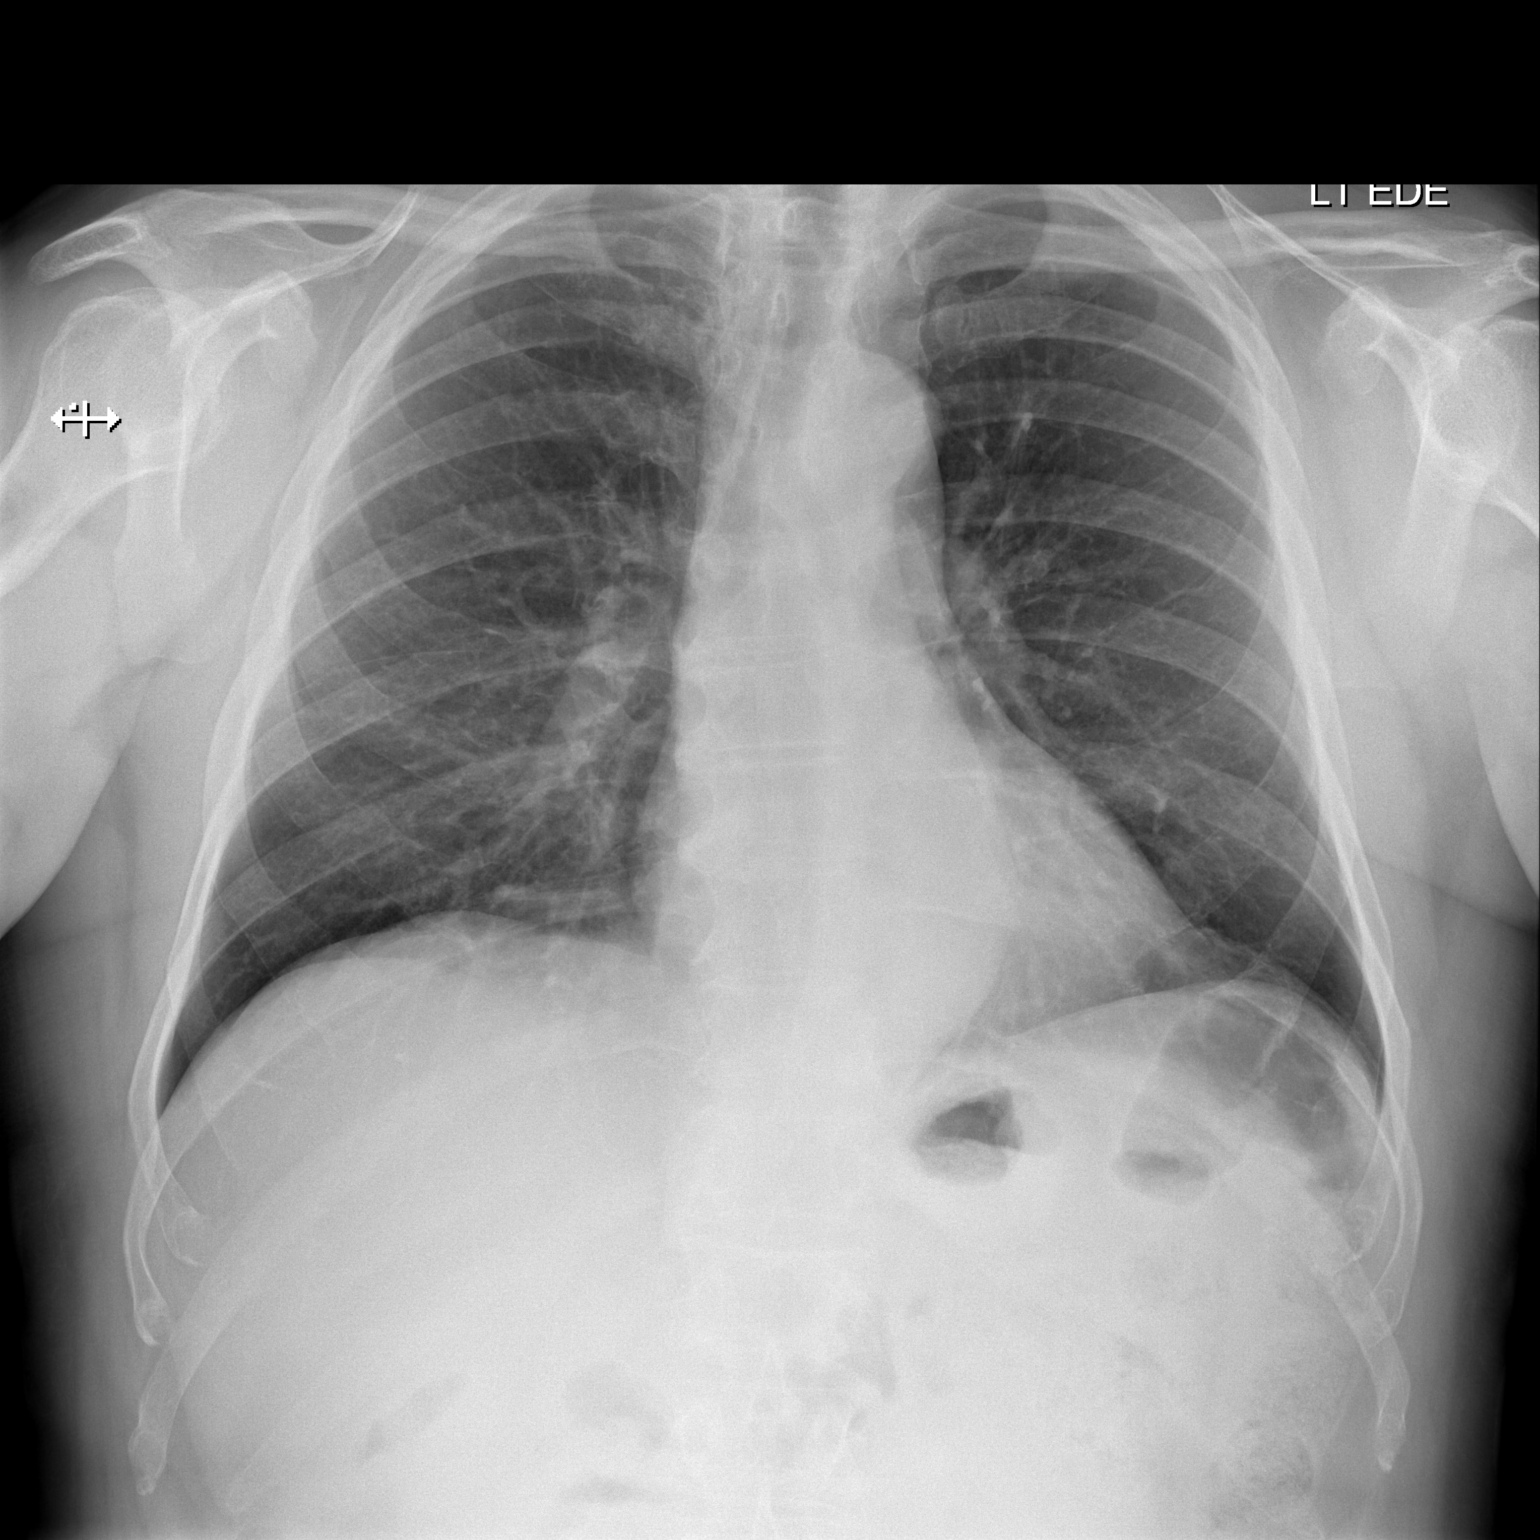

[w chest lat]
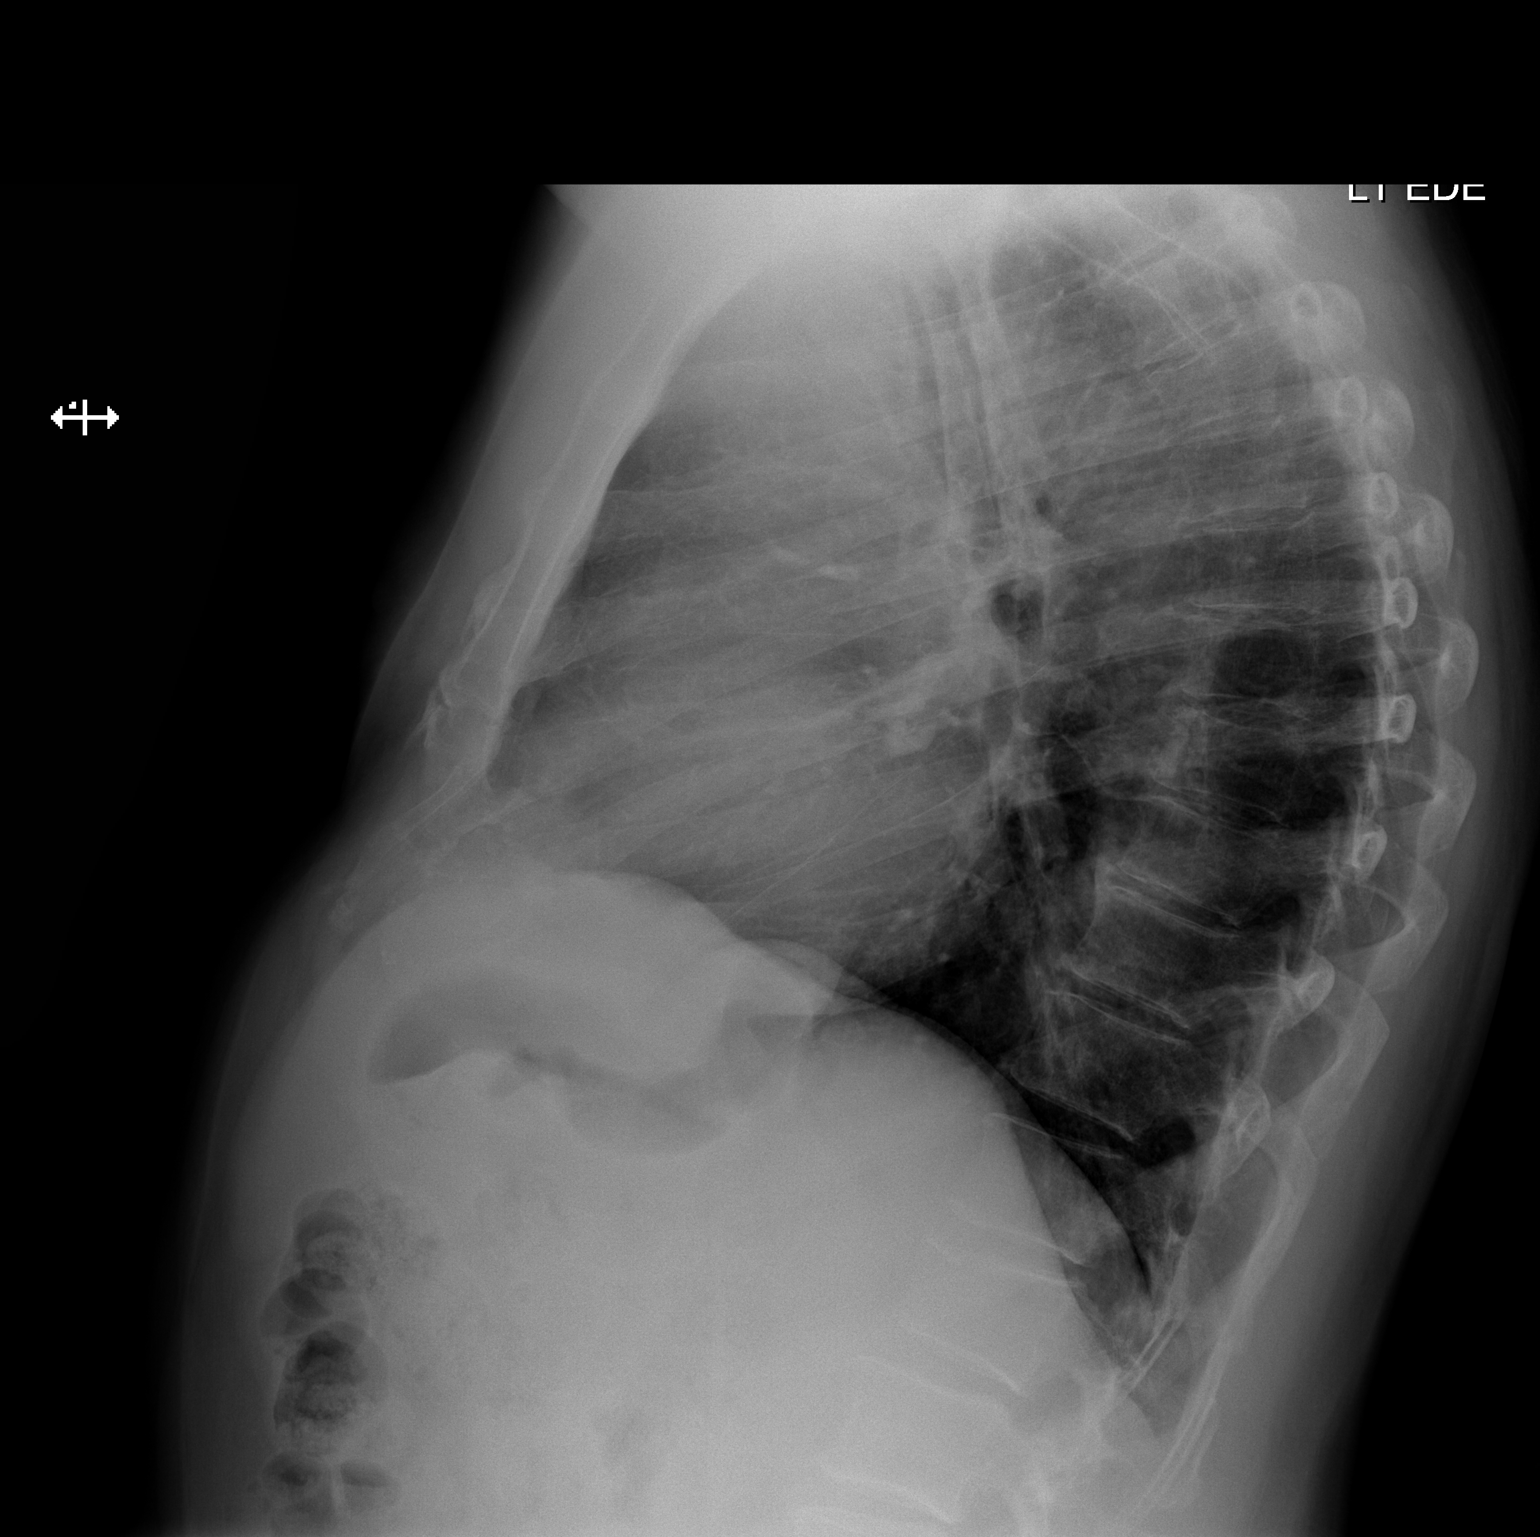

[2 of 2 positions shown; findings below may reference images not displayed]

FINDINGS: Mediastinum and hilar structures normal. Lungs are clear. Heart size
normal. Degenerative changes thoracic spine .
IMPRESSION: No active cardiopulmonary disease.
